# Patient Record
Sex: Male | Born: 1990 | Race: Black or African American | Hispanic: No | Marital: Single | Smoking: Heavy tobacco smoker
Health system: Southern US, Community
[De-identification: ages and names within clinical notes are randomized; demographics above are authoritative.]

## PROBLEM LIST (undated history)

## (undated) DIAGNOSIS — F329 Major depressive disorder, single episode, unspecified: Secondary | ICD-10-CM

## (undated) DIAGNOSIS — F419 Anxiety disorder, unspecified: Secondary | ICD-10-CM

## (undated) DIAGNOSIS — F32A Depression, unspecified: Secondary | ICD-10-CM

---
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
## 2010-05-20 ENCOUNTER — Emergency Department (HOSPITAL_COMMUNITY)
Admission: EM | Admit: 2010-05-20 | Discharge: 2010-05-20 | Payer: Self-pay | Source: Home / Self Care | Admitting: Emergency Medicine

## 2010-10-06 ENCOUNTER — Inpatient Hospital Stay (HOSPITAL_COMMUNITY)
Admission: EM | Admit: 2010-10-06 | Discharge: 2010-10-07 | DRG: 200 | Disposition: A | Discharge status: Home / Self Care | Payer: Self-pay | Attending: Internal Medicine | Admitting: Internal Medicine

## 2010-10-06 ENCOUNTER — Observation Stay (HOSPITAL_COMMUNITY): Payer: Self-pay

## 2010-10-06 ENCOUNTER — Emergency Department (HOSPITAL_COMMUNITY): Payer: Self-pay

## 2010-10-06 DIAGNOSIS — J9383 Other pneumothorax: Principal | ICD-10-CM | POA: Diagnosis present

## 2010-10-06 DIAGNOSIS — X58XXXA Exposure to other specified factors, initial encounter: Secondary | ICD-10-CM

## 2010-10-06 DIAGNOSIS — T797XXA Traumatic subcutaneous emphysema, initial encounter: Secondary | ICD-10-CM | POA: Diagnosis present

## 2010-10-06 LAB — BASIC METABOLIC PANEL
CO2: 25 mEq/L (ref 19–32)
Calcium: 9.4 mg/dL (ref 8.4–10.5)
Chloride: 102 mEq/L (ref 96–112)
Creatinine, Ser: 0.91 mg/dL (ref 0.4–1.5)
Creatinine, Ser: 0.93 mg/dL (ref 0.4–1.5)
GFR calc Af Amer: >60 mL/min (ref >60)
GFR calc Af Amer: >60 mL/min (ref >60)
GFR calc non Af Amer: >60 mL/min (ref >60)
Sodium: 136 mEq/L (ref 135–145)
Sodium: 138 mEq/L (ref 135–145)

## 2010-10-06 LAB — DIFFERENTIAL
Basophils Absolute: 0 10*3/uL (ref 0.0–0.1)
Basophils Absolute: 0 10*3/uL (ref 0.0–0.1)
Basophils Relative: 0 % (ref 0–1)
Eosinophils Absolute: 0.1 10*3/uL (ref 0.0–0.7)
Eosinophils Relative: 0 % (ref 0–5)
Lymphocytes Relative: 31 % (ref 12–46)
Lymphocytes Relative: 34 % (ref 12–46)
Lymphs Abs: 3.7 10*3/uL (ref 0.7–4.0)
Monocytes Absolute: 1 10*3/uL (ref 0.1–1.0)
Myelocytes: 0 %
Neutro Abs: 6.8 10*3/uL (ref 1.7–7.7)
Neutrophils Relative %: 61 % (ref 43–77)
Neutrophils Relative %: 62 % (ref 43–77)
Promyelocytes Absolute: 0 %
nRBC: 0 /100 WBC

## 2010-10-06 LAB — CBC
HCT: 39.9 % (ref 39.0–52.0)
MCH: 23.9 pg — ABNORMAL LOW (ref 26.0–34.0)
MCV: 72.3 fL — ABNORMAL LOW (ref 78.0–100.0)
Platelets: 219 10*3/uL (ref 150–400)
RBC: 6.06 MIL/uL — ABNORMAL HIGH (ref 4.22–5.81)
RBC: 5.52 MIL/uL (ref 4.22–5.81)
RDW: 14.6 % (ref 11.5–15.5)
WBC: 13.8 10*3/uL — ABNORMAL HIGH (ref 4.0–10.5)
WBC: 10.9 10*3/uL — ABNORMAL HIGH (ref 4.0–10.5)

## 2010-10-06 LAB — TYPE AND SCREEN

## 2010-10-06 LAB — PROTIME-INR
INR: 1.08 (ref 0.00–1.49)
Prothrombin Time: 14.2 seconds (ref 11.6–15.2)

## 2010-10-06 LAB — RAPID URINE DRUG SCREEN, HOSP PERFORMED
Amphetamines: NOT DETECTED
Cocaine: NOT DETECTED
Opiates: NOT DETECTED
Tetrahydrocannabinol: POSITIVE — AB

## 2010-10-06 LAB — ABO/RH: ABO/RH(D): A POS

## 2010-10-06 LAB — APTT: aPTT: 41 seconds — ABNORMAL HIGH (ref 24–37)

## 2010-10-16 NOTE — Discharge Summary (Signed)
  [name], [name] NO.:  [id number]  MEDICAL RECORD NO.:  [id number]  LOCATION:  1432                         FACILITY:  [facility]  PHYSICIAN:  Marinda Elk, M.D.DATE OF BIRTH:  [birth year]  DATE OF ADMISSION:  10/06/2010 DATE OF DISCHARGE:  10/07/2010                              DISCHARGE SUMMARY   PRIMARY CARE DOCTOR:  None.  DISCHARGE DIAGNOSIS:  Spontaneous pneumothorax.  DISCHARGE MEDICATIONS:  Tylenol 650 mg p.o. q.4 h. p.r.n.  PROCEDURES PERFORMED: 1. Esophagram that showed no evidence of injury or perforation to     Pathology. 2. Portable chest x-ray shows pneumomediastinum.  Small amount of     subcutaneous emphysema on the right. 3. Two-view chest x-ray shows no mediastinal, soft tissue emphysema in     the right subclavicular area.  This was most likely associated with     the old rupture.  BRIEF ADMITTING H AND P:  A 20 year old male with no past medical history who was coughing forcefully on the day of admission while eating cake.  Started developing chest pain, could not swallow so he came to the ED because the pain was so severe on further evaluation.  Please refer to dictation from October 06, 2010, for further details.  PHYSICAL EXAMINATION:  VITAL SIGNS:  Temperature 98, blood pressure 110/58, pulse 62, respirations 18, saturating 100% on room air. HEENT:  Atraumatic and normocephalic. GENERAL:  He is in no acute distress. LUNGS:  Good air movement.  Clear to auscultation. NECK:  There is some crepitation on the right side of the neck. CARDIOVASCULAR:  Regular rate and rhythm with positive S1 and S2.  No murmurs, rubs, or gallops. ABDOMEN:  Positive bowel sounds, nontender, nondistended, soft. EXTREMITIES:  Positive pulses.  No clubbing, cyanosis, or edema. NEUROLOGICAL:  Nonfocal.  LABORATORY DATA:  White count on admission of 13, hemoglobin of 14.  IMAGING:  As above.  ASSESSMENT/PLAN:  Primary spontaneous pneumothorax.  He  was admitted to the hospital, was monitored on telemetry with no events.  Esophagram was done with no events.  He did not desat.  He was breathing on room air and breathing all the time 100%.  His initial management with oxygen. There was no signs of aspiration.  Repeat CT scan was done.  By the next day, the patient was doing well, tolerating his diet, and not complaining of any shortness of breath, so he was discharged in stable condition.  VITALS ON THE DAY OF DISCHARGE:  Temperature 98, pulse of 80, respiration 18, blood pressure 111/63, he was saturating 100% on room air.  LABS ON DISCHARGE:  None.     Marinda Elk, M.D.     AF/MEDQ  D:  10/07/2010  T:  10/08/2010  Job:  [id number]  Electronically Signed by Marinda Elk M.D. on 10/16/2010 06:16:18 PM

## 2010-10-19 NOTE — H&P (Signed)
NAMEUMER, Jerry NO.:  [id number]  MEDICAL RECORD NO.:  [id number]  LOCATION:  WLED                         FACILITY:  [facility]  PHYSICIAN:  Michiel Cowboy, MDDATE OF BIRTH:  1990-10-13  DATE OF ADMISSION:  10/06/2010 DATE OF DISCHARGE:                             HISTORY & PHYSICAL   ATTENDING PHYSICIAN:  Michiel Cowboy, MD  PRIMARY CARE PROVIDER:  None.  CHIEF COMPLAINT:  Painful swallowing and shortness of breath.  HISTORY OF PRESENT ILLNESS:  Patient is a 20 year old gentleman, who was eating some pancakes at [facility], then he felt like somewhat was stuck in his throat.  He felt little kind of dry.  He tried to drink some sprite and there was just burning in his throat, no matter what he did he continued to have some painful swallowing.  He drank some orange juice, he drank some water.  He tried to throw up the contents.  He was trying to cough it out, but did not seem to help.  He finally went home because he continued to feel lot of discomfort in his throat area.  He was lying down in bed and started to feel very short of breath and became panicky. He called 911 and came to Emergency Department.  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  At which point, [facility] hospitalist was called for an admission since patient is stable and will need to have an esophagram done in the morning.  He currently denies any chest pain.  He is very comfortable, speaking in full sentences and laughing.  He does not endorse currently any significant shortness of breath.  He still have some throat pain as well as pain on the right side of his neck, which is at the site of his continues emphysema.  Otherwise, he does appear to be fairly comfortable.  REVIEW OF SYSTEMS:  Otherwise  negative except for HPI.  No fevers or chills.  No vomiting.  No nausea.  He was not successful in inducing his vomiting.  He did cough up some mucus, but that is all he could do.  PAST MEDICAL HISTORY:  None.  SOCIAL HISTORY:  Patient does admit being a smoker, drinks occasionally, occasional cannabis use.  FAMILY HISTORY:  None.  MEDICATIONS:  None.  ALLERGIES:  None.  PHYSICAL EXAMINATION:  VITAL SIGNS:  Temperature 98.2, blood pressure 110/58, pulse 62, respiration 18, satting 100% on room air. GENERAL:  Patient appears to be in no acute distress currently. HEAD:  Nontraumatic.  Moist mucous membranes.  There is definite crepitus on the right side of the neck. LUNGS:  Clear to auscultation bilaterally. HEART:  Regular rate and rhythm.  No murmurs appreciated. ABDOMEN:  Soft, nontender, nondistended. EXTREMITIES:  Lower extremities:  Without clubbing, cyanosis or edema. NEUROLOGICALLY:  Grossly intact.  LABORATORY DATA:  White blood cell count 13.8, hemoglobin 14.7.  DIAGNOSTIC STUDIES:  Chest x-ray showing pneumomediastinum, which is mild and soft tissue emphysema on the right.  ASSESSMENT AND PLAN:  This  is a 20 year old gentleman with pneumomediastinum and soft tissue emphysema after episode of choking on some pancakes with vigorous coughing and attempts at inducing vomiting. Soft tissue emphysema:  This is more likely secondary to alveolar rupture, but given his painful swallowing and this being associated with a sensation of something stuck in his throat, we will also obtain an esophagram firstly in the morning to evaluate for any small esophageal perforation.  Patient is hemodynamically stable and nontoxic appearing suggesting that even if this is a small rupture, it is likely not very significant.  We will obtain results of esophagram, if it is something involving the chest structures, we will need to have a Cardiothoracic surgery involved versus if it is more near  his neck, we will have Ear, Nose and Throat involved.  Right now, we will cover him with Zosyn and observe.  We will obtain repeat chest x-ray in a.m. to see if there is any improvement.  We will put on oxygen.  Monitor vitals frequently.  Place on telemetry.  If patient becomes unstable, we will need an emergent surgical consult, but at this point as I stated, he appears to be extremely stable.  We will write for Protonix prophylaxis and sequential compression devices.  We will make the patient n.p.o.  I have spent a total of 50 minutes on this admission.     Michiel Cowboy, MD     AVD/MEDQ  D:  10/06/2010  T:  10/06/2010  Job:  [id number]  Electronically Signed by Therisa Doyne MD on 10/19/2010 09:38:00 PM

## 2011-04-18 ENCOUNTER — Encounter: Payer: Self-pay | Admitting: *Deleted

## 2011-04-18 ENCOUNTER — Emergency Department (HOSPITAL_COMMUNITY)
Admission: EM | Admit: 2011-04-18 | Discharge: 2011-04-18 | Disposition: A | Discharge status: Home / Self Care | Payer: Self-pay | Attending: Emergency Medicine | Admitting: Emergency Medicine

## 2011-04-18 DIAGNOSIS — R21 Rash and other nonspecific skin eruption: Secondary | ICD-10-CM | POA: Insufficient documentation

## 2011-04-18 DIAGNOSIS — N342 Other urethritis: Secondary | ICD-10-CM | POA: Insufficient documentation

## 2011-04-18 DIAGNOSIS — A6000 Herpesviral infection of urogenital system, unspecified: Secondary | ICD-10-CM | POA: Insufficient documentation

## 2011-04-18 DIAGNOSIS — A6002 Herpesviral infection of other male genital organs: Secondary | ICD-10-CM

## 2011-04-18 MED ORDER — AZITHROMYCIN 250 MG PO TABS
1000.0000 mg | ORAL_TABLET | Freq: Once | ORAL | Status: AC
  Administered 2011-04-18: 1000 mg via ORAL
  Filled 2011-04-18: qty 4

## 2011-04-18 MED ORDER — VALACYCLOVIR HCL 1 G PO TABS: 1000.0000 mg | ORAL_TABLET | Freq: Two times a day (BID) | ORAL | Status: AC

## 2011-04-18 MED ORDER — LIDOCAINE HCL 1 % IJ SOLN
INTRAMUSCULAR | Status: AC
  Administered 2011-04-18: 20 mL via INTRAMUSCULAR
  Filled 2011-04-18: qty 20

## 2011-04-18 MED ORDER — AZITHROMYCIN 1 G PO PACK: 1.0000 g | PACK | Freq: Once | ORAL | Status: DC

## 2011-04-18 MED ORDER — CEFTRIAXONE SODIUM 250 MG IJ SOLR
250.0000 mg | Freq: Once | INTRAMUSCULAR | Status: AC
  Administered 2011-04-18: 250 mg via INTRAMUSCULAR
  Filled 2011-04-18: qty 250

## 2011-04-18 NOTE — ED Notes (Signed)
Continues to wait for results

## 2011-04-18 NOTE — ED Provider Notes (Signed)
History     CSN: [id number]  Arrival date & time 04/18/11  0113   First MD Initiated Contact with Patient 04/18/11 [phone]      Chief Complaint  Patient presents with  . Rash  . Groin Swelling    (Consider location/radiation/quality/duration/timing/severity/associated sxs/prior treatment) HPI Comments: Patient here with a several week history of rash - states intially noticed a rash in his pubic area and on the head of his penis - states now with mild painless excoriation of the area - continues with itchy papular rash to left upper leg - none on right - states recently had unprotected intercourse and is concerned now with STD's - also reports penile discharge without dysuria  Patient is a 20 y.o. male presenting with rash. The history is provided by the patient. No language interpreter was used.  Rash  This is a new problem. The current episode started more than 1 week ago. The problem has not changed since onset.The problem is associated with nothing. There has been no fever. The rash is present on the left upper leg, groin and genitalia. The pain is at a severity of 0/10. The patient is experiencing no pain. The pain has been constant since onset. Associated symptoms include itching. Pertinent negatives include no pain and no weeping. He has tried nothing for the symptoms.    History reviewed. No pertinent past medical history.  History reviewed. No pertinent past surgical history.  History reviewed. No pertinent family history.  History  Substance Use Topics  . Smoking status: Never Smoker   . Smokeless tobacco: Not on file  . Alcohol Use: No      Review of Systems  Constitutional: Negative for fever and chills.  HENT: Negative for ear pain.   Eyes: Negative for pain.  Respiratory: Negative for cough and shortness of breath.   Cardiovascular: Negative for chest pain.  Gastrointestinal: Negative for abdominal pain.  Genitourinary: Positive for discharge, penile swelling  and genital sores. Negative for dysuria, flank pain, scrotal swelling, penile pain and testicular pain.  Musculoskeletal: Negative for back pain.  Skin: Positive for itching and rash. Negative for wound.  Neurological: Negative for headaches.  Hematological: Negative for adenopathy.    Allergies  Review of patient's allergies indicates no known allergies.  Home Medications  No current outpatient prescriptions on file.  BP 168/89  Pulse 77  Temp(Src) 98 F (36.7 C) (Oral)  Resp 20  SpO2 100%  Physical Exam  Nursing note and vitals reviewed. Constitutional: He is oriented to person, place, and time. He appears well-developed and well-nourished. No distress.  HENT:  Head: Normocephalic and atraumatic.  Right Ear: External ear normal.  Left Ear: External ear normal.  Mouth/Throat: Oropharynx is clear and moist. No oropharyngeal exudate.  Eyes: Conjunctivae are normal. Pupils are equal, round, and reactive to light. No scleral icterus.  Neck: Normal range of motion. Neck supple.  Cardiovascular: Normal rate, regular rhythm and normal heart sounds.  Exam reveals no gallop and no friction rub.   No murmur heard. Pulmonary/Chest: Effort normal and breath sounds normal. No respiratory distress. He exhibits no tenderness.  Abdominal: Soft. Bowel sounds are normal. He exhibits no distension. There is no tenderness.  Genitourinary: Testes normal.    Right testis shows no mass, no swelling and no tenderness. Left testis shows no mass, no swelling and no tenderness. Circumcised. No penile tenderness. No discharge found.  Musculoskeletal: Normal range of motion.  Lymphadenopathy:    He has no cervical adenopathy.  Neurological: He is alert and oriented to person, place, and time. No cranial nerve deficit.  Skin: Skin is warm and dry. Purpura and rash noted. Rash is vesicular. No erythema. No pallor.     Psychiatric: He has a normal mood and affect. His behavior is normal. Judgment and  thought content normal.    ED Course  Procedures (including critical care time)   Labs Reviewed  HSV 1 ANTIBODY, IGG  HSV 2 ANTIBODY, IGG  RPR  GC/CHLAMYDIA PROBE AMP, GENITAL   No results found.  Urethritis herpes    MDM  Although the lesion on his penis are painless, they appear like herpes simplex 2 infection.  I will treat for this - have sent off HSV antibody and RPR - have treated for GC and chlamydia        Scarlette Calico C. [place], [place] 04/18/11 [phone]

## 2011-04-18 NOTE — ED Notes (Signed)
Patient instructed to wait 15 minutes s/p injection for reaction

## 2011-04-18 NOTE — ED Provider Notes (Signed)
Medical screening examination/treatment/procedure(s) were performed by non-physician practitioner and as supervising physician I was immediately available for consultation/collaboration.   Hanley Seamen, MD 04/18/11 [phone]

## 2011-04-18 NOTE — ED Notes (Signed)
Patient time is up and D/c'd to the lobby to wait on his mother

## 2011-04-18 NOTE — ED Notes (Signed)
Pt in c/o penile swelling and rash to inside of legs, has not noted rash to genital area, penile discharge noted

## 2011-04-18 NOTE — ED Notes (Signed)
PA in to see patient .  Penile culture obtained and sent to lab.Francesco Runner in to draw labs

## 2011-04-20 LAB — GC/CHLAMYDIA PROBE AMP, GENITAL: Chlamydia, DNA Probe: NEGATIVE

## 2012-05-08 ENCOUNTER — Encounter (HOSPITAL_COMMUNITY): Payer: Self-pay

## 2012-05-08 ENCOUNTER — Emergency Department (HOSPITAL_COMMUNITY)
Admission: EM | Admit: 2012-05-08 | Discharge: 2012-05-09 | Disposition: A | Discharge status: Home / Self Care | Payer: No Typology Code available for payment source | Attending: Emergency Medicine | Admitting: Emergency Medicine

## 2012-05-08 DIAGNOSIS — S199XXA Unspecified injury of neck, initial encounter: Secondary | ICD-10-CM | POA: Insufficient documentation

## 2012-05-08 DIAGNOSIS — S0993XA Unspecified injury of face, initial encounter: Secondary | ICD-10-CM | POA: Insufficient documentation

## 2012-05-08 DIAGNOSIS — F1210 Cannabis abuse, uncomplicated: Secondary | ICD-10-CM | POA: Insufficient documentation

## 2012-05-08 DIAGNOSIS — Y9389 Activity, other specified: Secondary | ICD-10-CM | POA: Insufficient documentation

## 2012-05-08 DIAGNOSIS — M542 Cervicalgia: Secondary | ICD-10-CM

## 2012-05-08 DIAGNOSIS — Y92410 Unspecified street and highway as the place of occurrence of the external cause: Secondary | ICD-10-CM | POA: Insufficient documentation
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
## 2012-05-09 ENCOUNTER — Emergency Department (HOSPITAL_COMMUNITY): Payer: No Typology Code available for payment source

## 2012-05-09 ENCOUNTER — Emergency Department (HOSPITAL_COMMUNITY)
Admission: EM | Admit: 2012-05-09 | Discharge: 2012-05-10 | Disposition: A | Discharge status: Home / Self Care | Payer: No Typology Code available for payment source | Attending: Emergency Medicine | Admitting: Emergency Medicine

## 2012-05-09 ENCOUNTER — Encounter (HOSPITAL_COMMUNITY): Payer: Self-pay | Admitting: *Deleted

## 2012-05-09 DIAGNOSIS — Z79899 Other long term (current) drug therapy: Secondary | ICD-10-CM | POA: Insufficient documentation

## 2012-05-09 DIAGNOSIS — Y939 Activity, unspecified: Secondary | ICD-10-CM | POA: Insufficient documentation

## 2012-05-09 DIAGNOSIS — S0990XA Unspecified injury of head, initial encounter: Secondary | ICD-10-CM | POA: Insufficient documentation

## 2012-05-09 DIAGNOSIS — Y92410 Unspecified street and highway as the place of occurrence of the external cause: Secondary | ICD-10-CM | POA: Insufficient documentation

## 2012-05-09 MED ORDER — CYCLOBENZAPRINE HCL 10 MG PO TABS: 10.0000 mg | ORAL_TABLET | Freq: Two times a day (BID) | ORAL | Status: DC | PRN
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
## 2012-05-10 MED ORDER — MELOXICAM 7.5 MG PO TABS: 15.0000 mg | ORAL_TABLET | Freq: Every day | ORAL | Status: DC

## 2012-05-10 MED ORDER — HYDROCODONE-ACETAMINOPHEN 5-325 MG PO TABS: 1.0000 | ORAL_TABLET | Freq: Four times a day (QID) | ORAL | Status: DC | PRN

## 2012-05-10 NOTE — ED Provider Notes (Signed)
Medical screening examination/treatment/procedure(s) were performed by non-physician practitioner and as supervising physician I was immediately available for consultation/collaboration.  Jones Skene, M.D.     Jones Skene, MD 05/10/12 [phone]

## 2012-05-16 NOTE — ED Provider Notes (Signed)
Medical screening examination/treatment/procedure(s) were performed by non-physician practitioner and as supervising physician I was immediately available for consultation/collaboration.   Richardean Canal, MD 05/16/12 1500

## 2015-07-02 ENCOUNTER — Encounter (HOSPITAL_BASED_OUTPATIENT_CLINIC_OR_DEPARTMENT_OTHER): Payer: Self-pay | Admitting: Emergency Medicine

## 2015-07-02 ENCOUNTER — Emergency Department (HOSPITAL_BASED_OUTPATIENT_CLINIC_OR_DEPARTMENT_OTHER)
Admission: EM | Admit: 2015-07-02 | Discharge: 2015-07-02 | Disposition: A | Discharge status: Home / Self Care | Payer: Self-pay | Attending: Emergency Medicine | Admitting: Emergency Medicine

## 2015-07-02 ENCOUNTER — Emergency Department (HOSPITAL_BASED_OUTPATIENT_CLINIC_OR_DEPARTMENT_OTHER): Payer: Self-pay

## 2015-07-02 DIAGNOSIS — J40 Bronchitis, not specified as acute or chronic: Secondary | ICD-10-CM | POA: Insufficient documentation

## 2015-07-02 DIAGNOSIS — Z791 Long term (current) use of non-steroidal anti-inflammatories (NSAID): Secondary | ICD-10-CM | POA: Insufficient documentation

## 2015-07-02 DIAGNOSIS — J111 Influenza due to unidentified influenza virus with other respiratory manifestations: Secondary | ICD-10-CM | POA: Insufficient documentation

## 2015-07-02 DIAGNOSIS — F17210 Nicotine dependence, cigarettes, uncomplicated: Secondary | ICD-10-CM | POA: Insufficient documentation

## 2015-07-02 LAB — RAPID STREP SCREEN (MED CTR MEBANE ONLY): Streptococcus, Group A Screen (Direct): NEGATIVE

## 2015-07-02 MED ORDER — HYDROCOD POLST-CPM POLST ER 10-8 MG/5ML PO SUER: 5.0000 mL | Freq: Two times a day (BID) | ORAL | Status: DC | PRN

## 2015-07-02 MED ORDER — HYDROCOD POLST-CPM POLST ER 10-8 MG/5ML PO SUER
5.0000 mL | Freq: Once | ORAL | Status: AC
  Administered 2015-07-02: 5 mL via ORAL
  Filled 2015-07-02: qty 5

## 2015-07-02 MED ORDER — ALBUTEROL SULFATE HFA 108 (90 BASE) MCG/ACT IN AERS
2.0000 | INHALATION_SPRAY | RESPIRATORY_TRACT | Status: DC | PRN
  Administered 2015-07-02: 2 via RESPIRATORY_TRACT
  Filled 2015-07-02: qty 6.7

## 2015-07-02 NOTE — ED Notes (Signed)
Pt reports frequent cough to the point his throat is now sore

## 2015-07-02 NOTE — ED Provider Notes (Signed)
CSN: [id number]     Arrival date & time 07/02/15  0252 History   First MD Initiated Contact with Patient 07/02/15 0344     Chief Complaint  Patient presents with  . Cough     (Consider location/radiation/quality/duration/timing/severity/associated sxs/prior Treatment) HPI  This is a 25 year old male with a two-day history of fever, body aches, malaise, scratchy throat, rhinorrhea, postnasal drip and cough. The cough has been severe at times and occurs in paroxysms. He feels like he is not able to take a full deep breath. He is not aware of wheezing. He has not had nausea, vomiting or diarrhea.  History reviewed. No pertinent past medical history. History reviewed. No pertinent past surgical history. History reviewed. No pertinent family history. Social History  Substance Use Topics  . Smoking status: Heavy Tobacco Smoker    Types: Cigarettes  . Smokeless tobacco: None  . Alcohol Use: No    Review of Systems  All other systems reviewed and are negative.   Allergies  Review of patient's allergies indicates no known allergies.  Home Medications   Prior to Admission medications   Medication Sig Start Date End Date Taking? Authorizing Provider  cyclobenzaprine (FLEXERIL) 10 MG tablet Take 1 tablet (10 mg total) by mouth 2 (two) times daily as needed for muscle spasms. 05/09/12   Nicole Pisciotta, PA-C  HYDROcodone-acetaminophen (NORCO) 5-325 MG per tablet Take 1-2 tablets by mouth every 6 (six) hours as needed for pain. 05/10/12   Arthor CaptainAbigail Harris, PA-C  meloxicam (MOBIC) 7.5 MG tablet Take 2 tablets (15 mg total) by mouth daily. 05/10/12   Abigail Harris, PA-C   BP 134/80 mmHg  Pulse 94  Temp(Src) 100.6 F (38.1 C) (Oral)  Resp 18  SpO2 98%   Physical Exam  General: Well-developed, well-nourished male in no acute distress; appearance consistent with age of record HENT: normocephalic; atraumatic; pharyngeal erythema without exudate Eyes: pupils equal, round and reactive to  light; extraocular muscles intact Neck: supple; no lymphadenopathy Heart: regular rate and rhythm Lungs: Decreased air movement without wheezing Abdomen: soft; nondistended; nontender; bowel sounds present Extremities: No deformity; full range of motion; pulses normal Neurologic: Awake, alert and oriented; motor function intact in all extremities and symmetric; no facial droop Skin: Warm and dry Psychiatric: Normal mood and affect    ED Course  Procedures (including critical care time)   MDM   Nursing notes and vitals signs, including pulse oximetry, reviewed.  Summary of this visit's results, reviewed by myself:  Labs:  Results for orders placed or performed during the hospital encounter of 07/02/15 (from the past 24 hour(s))  Rapid strep screen     Status: None   Collection Time: 07/02/15  3:20 AM  Result Value Ref Range   Streptococcus, Group A Screen (Direct) NEGATIVE NEGATIVE    Imaging Studies: Dg Chest 2 View  07/02/2015  CLINICAL DATA:  Cough with sore throat for 2 days. EXAM: CHEST  2 VIEW COMPARISON:  Chest and rib radiographs 05/10/2012 FINDINGS: The cardiomediastinal contours are normal. The lungs are clear. Pulmonary vasculature is normal. No consolidation, pleural effusion, or pneumothorax. No acute osseous abnormalities are seen. IMPRESSION: No acute pulmonary process. Electronically Signed   By: Rubye OaksMelanie  Ehinger M.D.   On: 07/02/2015 03:47       Paula LibraJohn Amra Shukla, MD 07/02/15 [phone]

## 2015-07-04 LAB — CULTURE, GROUP A STREP (THRC)

## 2015-09-02 ENCOUNTER — Encounter (HOSPITAL_COMMUNITY): Payer: Self-pay | Admitting: Nurse Practitioner

## 2015-09-02 ENCOUNTER — Ambulatory Visit (HOSPITAL_COMMUNITY)
Admission: EM | Admit: 2015-09-02 | Discharge: 2015-09-02 | Disposition: A | Discharge status: Home / Self Care | Payer: No Typology Code available for payment source | Attending: Emergency Medicine | Admitting: Emergency Medicine

## 2015-09-02 DIAGNOSIS — N489 Disorder of penis, unspecified: Secondary | ICD-10-CM

## 2015-09-02 DIAGNOSIS — R21 Rash and other nonspecific skin eruption: Secondary | ICD-10-CM

## 2015-09-02 DIAGNOSIS — F17210 Nicotine dependence, cigarettes, uncomplicated: Secondary | ICD-10-CM | POA: Insufficient documentation

## 2015-09-02 LAB — POCT URINALYSIS DIP (DEVICE)
BILIRUBIN URINE: NEGATIVE
GLUCOSE, UA: NEGATIVE mg/dL
Hgb urine dipstick: NEGATIVE
KETONES UR: NEGATIVE mg/dL
LEUKOCYTES UA: NEGATIVE
Nitrite: NEGATIVE
Protein, ur: NEGATIVE mg/dL
SPECIFIC GRAVITY, URINE: 1.025 (ref 1.005–1.030)
UROBILINOGEN UA: 0.2 mg/dL (ref 0.0–1.0)
pH: 6 (ref 5.0–8.0)

## 2015-09-02 MED ORDER — MUPIROCIN 2 % EX OINT: 1.0000 "application " | TOPICAL_OINTMENT | Freq: Two times a day (BID) | CUTANEOUS | Status: DC

## 2015-09-02 MED ORDER — VALACYCLOVIR HCL 1 G PO TABS: 1000.0000 mg | ORAL_TABLET | Freq: Two times a day (BID) | ORAL | Status: AC

## 2015-09-02 NOTE — ED Notes (Signed)
Pt c/o itchy, open bumps to penis. Denies any penile discharge or burning. Concerned for STD.

## 2015-09-02 NOTE — ED Provider Notes (Signed)
CSN: [id number]     Arrival date & time 09/02/15  1722 History   First MD Initiated Contact with Patient 09/02/15 1931     Chief Complaint  Patient presents with  . SEXUALLY TRANSMITTED DISEASE   (Consider location/radiation/quality/duration/timing/severity/associated sxs/prior Treatment) HPI  He is a 25 year old man here for STD check. He developed a rash on the base of his penis several days ago. He states initially it was a bump, he doesn't think it was a blister. It was itchy and is now an open sore. He has had several other bumps come up. No penile discharge. No dysuria. He does report a new sexual partner. They did not use condoms. No fevers.  History reviewed. No pertinent past medical history. History reviewed. No pertinent past surgical history. History reviewed. No pertinent family history. Social History  Substance Use Topics  . Smoking status: Heavy Tobacco Smoker    Types: Cigarettes  . Smokeless tobacco: None  . Alcohol Use: Yes    Review of Systems As in history of present illness Allergies  Review of patient's allergies indicates no known allergies.  Home Medications   Prior to Admission medications   Medication Sig Start Date End Date Taking? Authorizing Provider  mupirocin ointment (BACTROBAN) 2 % Apply 1 application topically 2 (two) times daily. 09/02/15   Charm RingsErin J Leul Narramore, MD  valACYclovir (VALTREX) 1000 MG tablet Take 1 tablet (1,000 mg total) by mouth 2 (two) times daily. 09/02/15 09/16/15  Charm RingsErin J Alexandr Yaworski, MD   Meds Ordered and Administered this Visit  Medications - No data to display  BP 120/72 mmHg  Pulse 88  Temp(Src) 98.2 F (36.8 C) (Oral)  Resp 18  SpO2 100% No data found.   Physical Exam  Constitutional: He is oriented to person, place, and time. He appears well-developed and well-nourished. No distress.  Cardiovascular: Normal rate.   Pulmonary/Chest: Effort normal.  Genitourinary:  He has several papules at the base of the penis. One of these is  open, likely from excoriation.  Neurological: He is alert and oriented to person, place, and time.    ED Course  Procedures (including critical care time)  Labs Review Labs Reviewed  HERPES SIMPLEX VIRUS CULTURE  HIV ANTIBODY (ROUTINE TESTING)  RPR  POCT URINALYSIS DIP (DEVICE)  URINE CYTOLOGY ANCILLARY ONLY    Imaging Review No results found.   MDM   1. Penile rash    STD testing collected, including HSV culture. Will place presumptively on Valtrex until culture returns. Also mupirocin ointment twice a day to cover folliculitis. Follow-up as needed.    Charm RingsErin J Hazelee Harbold, MD 09/02/15 2003

## 2015-09-02 NOTE — Discharge Instructions (Signed)
We have collected testing for STDs. We will call you with the results in 2-3 days. In the meantime, please take Valtrex twice a day. This will cover possible herpes. Use the mupirocin ointment twice a day on the rash. Follow-up as needed.

## 2015-09-03 LAB — HIV ANTIBODY (ROUTINE TESTING W REFLEX): HIV Screen 4th Generation wRfx: NONREACTIVE

## 2015-09-03 LAB — URINE CYTOLOGY ANCILLARY ONLY
CHLAMYDIA, DNA PROBE: NEGATIVE
NEISSERIA GONORRHEA: NEGATIVE
Trichomonas: NEGATIVE

## 2015-09-03 LAB — RPR: RPR Ser Ql: NONREACTIVE

## 2015-09-04 LAB — HERPES SIMPLEX VIRUS(HSV) DNA BY PCR
HSV 1 DNA: NEGATIVE
HSV 2 DNA: POSITIVE — AB

## 2015-09-10 ENCOUNTER — Telehealth (HOSPITAL_COMMUNITY): Payer: Self-pay | Admitting: Emergency Medicine

## 2015-09-10 NOTE — ED Notes (Signed)
LM on pt's VM [phone] Need to give lab results from recent visit on 5/8 Also let pt know labs can be obtained from MyChart  Per Dr. Dayton ScrapeMurray,  Clinical staff, please notify patient that he is positive for genital herpes. He should continue the prescription for Valtrex. If he develops recurrent lesions, he can follow up here or with his PCP

## 2015-09-13 NOTE — ED Notes (Signed)
Called [name] and notified of recent lab results from visit 5/8 [name] ID'd properly... Reports feeling better and sx have subsided w/o taking Rx's; reports he never p/u Rx at pharmacy.   Adv [name] to p/u Rx as soon as possible b/c pharmacy will put them back and he will have to be seen to get another Rx.   Per Dr. Dayton ScrapeMurray,  Clinical staff, please notify patient that he is positive for genital herpes. He should continue the prescription for Valtrex. If he develops recurrent lesions, he can follow up here or with his PCP  Adv [name] if sx are not getting better to return  [name] verb understanding Education on safe sex given

## 2016-08-08 ENCOUNTER — Encounter (HOSPITAL_COMMUNITY): Payer: Self-pay | Admitting: Emergency Medicine

## 2016-08-08 ENCOUNTER — Emergency Department (HOSPITAL_COMMUNITY)
Admission: EM | Admit: 2016-08-08 | Discharge: 2016-08-08 | Disposition: A | Discharge status: Home / Self Care | Payer: Self-pay | Attending: Emergency Medicine | Admitting: Emergency Medicine

## 2016-08-08 DIAGNOSIS — Y92008 Other place in unspecified non-institutional (private) residence as the place of occurrence of the external cause: Secondary | ICD-10-CM | POA: Insufficient documentation

## 2016-08-08 DIAGNOSIS — T148XXA Other injury of unspecified body region, initial encounter: Secondary | ICD-10-CM

## 2016-08-08 DIAGNOSIS — M545 Low back pain, unspecified: Secondary | ICD-10-CM

## 2016-08-08 DIAGNOSIS — S61412A Laceration without foreign body of left hand, initial encounter: Secondary | ICD-10-CM

## 2016-08-08 DIAGNOSIS — S60456A Superficial foreign body of right little finger, initial encounter: Secondary | ICD-10-CM | POA: Insufficient documentation

## 2016-08-08 DIAGNOSIS — Y9339 Activity, other involving climbing, rappelling and jumping off: Secondary | ICD-10-CM | POA: Insufficient documentation

## 2016-08-08 DIAGNOSIS — Y999 Unspecified external cause status: Secondary | ICD-10-CM | POA: Insufficient documentation

## 2016-08-08 DIAGNOSIS — F17210 Nicotine dependence, cigarettes, uncomplicated: Secondary | ICD-10-CM | POA: Insufficient documentation

## 2016-08-08 DIAGNOSIS — W1839XA Other fall on same level, initial encounter: Secondary | ICD-10-CM | POA: Insufficient documentation

## 2016-08-08 DIAGNOSIS — Z23 Encounter for immunization: Secondary | ICD-10-CM | POA: Insufficient documentation

## 2016-08-08 MED ORDER — CEPHALEXIN 500 MG PO CAPS: 500.0000 mg | ORAL_CAPSULE | Freq: Four times a day (QID) | ORAL | 0 refills | Status: AC

## 2016-08-08 MED ORDER — TETANUS-DIPHTH-ACELL PERTUSSIS 5-2.5-18.5 LF-MCG/0.5 IM SUSP
0.5000 mL | Freq: Once | INTRAMUSCULAR | Status: AC
  Administered 2016-08-08: 0.5 mL via INTRAMUSCULAR
  Filled 2016-08-08: qty 0.5

## 2016-08-08 NOTE — ED Notes (Signed)
Wound cleansed and clean, dry dressing placed.

## 2016-08-08 NOTE — ED Provider Notes (Signed)
[facility]-EMERGENCY DEPT Provider Note   CSN: [id number] Arrival date & time: 08/08/16  1526  By signing my name below, I, Nelwyn Salisbury, attest that this documentation has been prepared under the direction and in the presence of non-physician practitioner, Candie Mile, PA-C. Electronically Signed: Nelwyn Salisbury, Scribe. 08/08/2016. 4:54 PM.  History   Chief Complaint Chief Complaint  Patient presents with  . Back Pain  . Hand Injury   The history is provided by the patient. No language interpreter was used.     HPI Comments:  [name] is an otherwise healthy 26 y.o. male who presents to the Emergency Department complaining of waxing/waning, mild lower back pain s/p fall two days ago. Pt was on a deck when he fell, scraped his left hand, and landed on his back on top of a piece of wood. He describes his back pain as 1/10 at its best and 8/10 at its worst depending on how he is seated. He reports associated left hand pain, left hand injury, subjective fever, splinter to his right fifth digit and light-headedness after the fall. He does report hitting his head after hitting his back but denies LOC. Pt has tried bandaging and keeping the area clean but notes that his wounds have been draining pus. He has tried OTC pain killers with some relief of his pain. Denies any head injury, headache, syncope, visual disturbance, nausea, vomiting, urinary incontinence, weakness, numbness or chills. Pt's tetanus is not up to date. He denies use of blood thinners. He denies any other injuries.   History reviewed. No pertinent past medical history.  There are no active problems to display for this patient.   History reviewed. No pertinent surgical history.     Home Medications    Prior to Admission medications   Medication Sig Start Date End Date Taking? Authorizing Provider  cephALEXin (KEFLEX) 500 MG capsule Take 1 capsule (500 mg total) by mouth 4 (four) times daily. 08/08/16 08/11/16   Beuna Bolding Manuel Claudene Gatliff, PA  mupirocin ointment (BACTROBAN) 2 % Apply 1 application topically 2 (two) times daily. 09/02/15   Charm Rings, MD    Family History No family history on file.  Social History Social History  Substance Use Topics  . Smoking status: Heavy Tobacco Smoker    Types: Cigarettes  . Smokeless tobacco: Current User  . Alcohol use Yes     Allergies   Patient has no known allergies.   Review of Systems Review of Systems  Constitutional: Positive for fever (Subjective). Negative for chills.  Eyes: Negative for visual disturbance.  Genitourinary: Negative for difficulty urinating and dysuria.       Negative for Incontinence  Musculoskeletal: Positive for arthralgias and back pain.  Skin: Positive for wound.  Neurological: Positive for light-headedness. Negative for syncope, weakness, numbness and headaches.     Physical Exam Updated Vital Signs BP 115/74 (BP Location: Left Arm)   Pulse 67   Temp 98.1 F (36.7 C) (Oral)   Resp 16   Ht 6' (1.829 m)   Wt 81.4 kg   SpO2 100%   BMI 24.35 kg/m   Physical Exam  Constitutional: He is oriented to person, place, and time. He appears well-developed and well-nourished.  Well appearing.   HENT:  Head: Normocephalic and atraumatic.  Nose: Nose normal.  Mouth/Throat: Oropharynx is clear and moist.  The wound, redness, swelling, TTP,  injury noted to head or scalp.  Eyes: Conjunctivae and EOM are normal. Pupils are equal, round, and  reactive to light.  Neck: Normal range of motion. No JVD present.  Normal ROM, no neck tenderness. No nuchal rigidity  Cardiovascular: Normal rate, normal heart sounds and intact distal pulses.   No murmur heard. Pulmonary/Chest: Effort normal and breath sounds normal. No stridor. No respiratory distress. He has no wheezes. He has no rales.  Normal work of breathing. No respiratory distress noted.   Abdominal: Soft. Bowel sounds are normal. There is no tenderness. There is no  rebound and no guarding.  Soft and nontender. No rebound or guarding. No pulsatile mass noted.   Musculoskeletal: Normal range of motion. He exhibits tenderness. He exhibits no deformity.  There is tenderness to lower lumbar/sacral spine. No midline cervical, thoracic. No paraspinal tenderness. Good ROM of spine. No deformity. No obvious wound, redness, or swelling noted.   Neurological: He is alert and oriented to person, place, and time.  Cranial Nerves:  III,IV, VI: ptosis not present, extra-ocular movements intact bilaterally, direct and consensual pupillary light reflexes intact bilaterally V: facial sensation, jaw opening, and bite strength equal bilaterally VII: eyebrow raise, eyelid close, smile, frown, pucker equal bilaterally VIII: hearing grossly normal bilaterally  IX,X: palate elevation and swallowing intact XI: bilateral shoulder shrug and lateral head rotation equal and strong XII: midline tongue extension  Negative pronator drift, negative Romberg, negative RAM's, negative heel-to-shin, negative finger to nose.    Sensory intact.  Muscle strength 5/5 Patient able to ambulate without difficulty.   SLR Right -  negative SLR Left -  negative  Skin: Skin is warm. Capillary refill takes less than 2 seconds. No erythema.  1cm splinter to fifth digit of right hand. No pain on palpation. No surrounding erythema. Sensation, strength against resistance to flexion and extension of affected finger, and distal pulses intact.   6cm superficial laceration with two separate 1cm areas of slight opening of superficial skin on palm of left hand. No deep laceration requiring a laceration even if it was within the window of time for possible suture. No surrounding erythema. Sensation, strength good, and distal pulses intact. Strength good against resistance to left wrist, and finger flexion and extension. Able to make a fist with no difficulty.  No foreign bodies noted.   Psychiatric: He has a  normal mood and affect. His behavior is normal.  Nursing note and vitals reviewed.    ED Treatments / Results  DIAGNOSTIC STUDIES:  Oxygen Saturation is 100% on RA, normal by my interpretation.    COORDINATION OF CARE:  5:27 PM Discussed treatment plan with pt at bedside which includes exploration of wound on left hand and pt agreed to plan.  Labs (all labs ordered are listed, but only abnormal results are displayed) Labs Reviewed - No data to display  EKG  EKG Interpretation None       Radiology No results found.  Procedures .Foreign Body Removal Date/Time: 08/08/2016 5:29 PM Performed by: Alvina Chou Authorized by: Alvina Chou  Consent: Verbal consent obtained. Risks and benefits: risks, benefits and alternatives were discussed Consent given by: patient Patient understanding: patient states understanding of the procedure being performed Patient consent: the patient's understanding of the procedure matches consent given Procedure consent: procedure consent matches procedure scheduled Relevant documents: relevant documents present and verified Patient identity confirmed: verbally with patient Body area: skin General location: upper extremity Location details: right index finger  Sedation: Patient sedated: no Patient restrained: no Patient cooperative: yes Localization method: visualized Removal mechanism: forceps Dressing: antibiotic ointment Tendon involvement:  none Depth: subcutaneous Complexity: simple 1 objects recovered. Objects recovered: Splinter Post-procedure assessment: foreign body removed Patient tolerance: Patient tolerated the procedure well with no immediate complications   (including critical care time)  Medications Ordered in ED Medications  Tdap (BOOSTRIX) injection 0.5 mL (0.5 mLs Intramuscular Given 08/08/16 1750)     Initial Impression / Assessment and Plan / ED Course  I have reviewed the triage vital  signs and the nursing notes.  Pertinent labs & imaging results that were available during my care of the patient were reviewed by me and considered in my medical decision making (see chart for details).    Patient here with post mechanical fall 2 days ago. Patient with back pain. Possible tail bone injury.  No neurological deficits and normal neuro exam. Patient is ambulatory.  No loss of bowel or bladder control.  No concern for cauda equina.  No fever here, night sweats, weight loss, h/o cancer, IVDA, no recent procedure to back. No urinary symptoms suggestive of UTI.  Supportive care and return precaution discussed.   Patient's left hand with injury appears to have a laceration about 6 cm. No deep laceration going through entirety of skin, but does go through superficial skin. It is over the past 12 hours of incidents and not candidate for laceration repair. No surrounding erythema, does not appear to be infectious. No tenderness to palpation.  Right fifth digit with apparent splinter. No surrounding erythema. No discharge. Does not appear to be infectious. No tenderness to palpation. Splinter removed by me without requiring anesthesia and no pain or complications. He has full sensation, muscle strength against resistance to flexion and extension and distal pulses are intact to bilateral upper extremities including hands and distal to areas affected.   Due to patients history and exam I do not think it is appropriate to do CT of head especially considering the incident happened several days ago.   Patient will be given tetanus here in ED. Patient will be given antibiotics with instructions to follow up with his primary care provider, urgent care, or back here in ED for wound recheck. Appears safe for discharge at this time. Follow up as indicated in discharge paperwork. Reasons to immediately return to the ED discussed.    Final Clinical Impressions(s) / ED Diagnoses   Final diagnoses:  Acute  midline low back pain without sciatica  Laceration of left hand without foreign body, initial encounter  Splinter in skin    New Prescriptions New Prescriptions   CEPHALEXIN (KEFLEX) 500 MG CAPSULE    Take 1 capsule (500 mg total) by mouth 4 (four) times daily.  I personally performed the services described in this documentation, which was scribed in my presence. The recorded information has been reviewed and is accurate.    [address] [place], [place] 08/08/16 1814    Jacalyn Lefevre, MD 08/08/16 2001

## 2016-08-08 NOTE — ED Triage Notes (Signed)
Pt. Stated, I was jumping on the porch and fell back, scraped my hands and hurt my back, lower where I sit.

## 2016-08-08 NOTE — Discharge Instructions (Signed)
Please take Keflex 4 times daily for 3 days. Keep area clean and dry. Please also use Neosporin over the area affected. Please follow up and 35 days to your primary care provider, urgent care or back here in ED for wound recheck. Please return if she see any redness from the area, fevers, chills, increased pain.  Get help right away if: You develop new bowel or bladder control problems. You have unusual weakness or numbness in your arms or legs. You develop nausea or vomiting. You develop abdominal pain. You feel faint. Contact a health care provider if: You develop unusual or increased swelling or redness around the wound. You have increasing pain or tenderness. There is increasing fluid (drainage) or a bad smelling drainage coming from the wound.

## 2017-02-16 ENCOUNTER — Emergency Department (HOSPITAL_COMMUNITY)
Admission: EM | Admit: 2017-02-16 | Discharge: 2017-02-16 | Disposition: A | Discharge status: Home / Self Care | Payer: Self-pay | Attending: Emergency Medicine | Admitting: Emergency Medicine

## 2017-02-16 ENCOUNTER — Encounter (HOSPITAL_COMMUNITY): Payer: Self-pay | Admitting: Emergency Medicine

## 2017-02-16 DIAGNOSIS — R309 Painful micturition, unspecified: Secondary | ICD-10-CM | POA: Insufficient documentation

## 2017-02-16 DIAGNOSIS — Z5321 Procedure and treatment not carried out due to patient leaving prior to being seen by health care provider: Secondary | ICD-10-CM | POA: Insufficient documentation

## 2017-02-16 NOTE — ED Triage Notes (Signed)
Patient reports that he had lots of bubbles in urine over the past couple weeks and states that had he had little stinging with urination. Patient reports that he wants to be checked for HIV, Hep C, Syphilis due to having sexual relations with a girl who has extreme bleeding gums.  Patient states, "I dont [facility] to be [facility] and chlamydia because I dont want no stick up in me".

## 2017-02-16 NOTE — ED Triage Notes (Signed)
Pt not in lobby.  

## 2017-02-16 NOTE — ED Notes (Signed)
Bed: WTR6 Expected date:  Expected time:  Means of arrival:  Comments: 

## 2017-02-28 ENCOUNTER — Emergency Department (HOSPITAL_COMMUNITY): Payer: Self-pay

## 2017-02-28 ENCOUNTER — Other Ambulatory Visit: Payer: Self-pay

## 2017-02-28 ENCOUNTER — Emergency Department (HOSPITAL_COMMUNITY)
Admission: EM | Admit: 2017-02-28 | Discharge: 2017-02-28 | Disposition: A | Discharge status: Home / Self Care | Payer: Self-pay | Attending: Emergency Medicine | Admitting: Emergency Medicine

## 2017-02-28 ENCOUNTER — Encounter (HOSPITAL_COMMUNITY): Payer: Self-pay | Admitting: *Deleted

## 2017-02-28 DIAGNOSIS — F17210 Nicotine dependence, cigarettes, uncomplicated: Secondary | ICD-10-CM | POA: Insufficient documentation

## 2017-02-28 DIAGNOSIS — K5909 Other constipation: Secondary | ICD-10-CM | POA: Insufficient documentation

## 2017-02-28 DIAGNOSIS — R1084 Generalized abdominal pain: Secondary | ICD-10-CM | POA: Insufficient documentation

## 2017-02-28 LAB — URINALYSIS, ROUTINE W REFLEX MICROSCOPIC
Bilirubin Urine: NEGATIVE
GLUCOSE, UA: NEGATIVE mg/dL
HGB URINE DIPSTICK: NEGATIVE
Ketones, ur: 20 mg/dL — AB
Leukocytes, UA: NEGATIVE
Nitrite: NEGATIVE
PH: 6 (ref 5.0–8.0)
Protein, ur: NEGATIVE mg/dL
SPECIFIC GRAVITY, URINE: 1.028 (ref 1.005–1.030)

## 2017-02-28 LAB — LIPASE, BLOOD: LIPASE: 19 U/L (ref 11–51)

## 2017-02-28 LAB — CBC WITH DIFFERENTIAL/PLATELET
BASOS PCT: 1 %
Basophils Absolute: 0.1 10*3/uL (ref 0.0–0.1)
EOS PCT: 4 %
Eosinophils Absolute: 0.4 10*3/uL (ref 0.0–0.7)
HEMATOCRIT: 43.7 % (ref 39.0–52.0)
HEMOGLOBIN: 14.5 g/dL (ref 13.0–17.0)
Lymphocytes Relative: 37 %
Lymphs Abs: 3.5 10*3/uL (ref 0.7–4.0)
MCH: 24.5 pg — AB (ref 26.0–34.0)
MCHC: 33.2 g/dL (ref 30.0–36.0)
MCV: 73.7 fL — AB (ref 78.0–100.0)
MONO ABS: 0.7 10*3/uL (ref 0.1–1.0)
MONOS PCT: 7 %
NEUTROS PCT: 51 %
Neutro Abs: 4.7 10*3/uL (ref 1.7–7.7)
PLATELETS: 216 10*3/uL (ref 150–400)
RBC: 5.93 MIL/uL — ABNORMAL HIGH (ref 4.22–5.81)
RDW: 14.3 % (ref 11.5–15.5)
WBC: 9.4 10*3/uL (ref 4.0–10.5)

## 2017-02-28 LAB — COMPREHENSIVE METABOLIC PANEL
ALT: 10 U/L — AB (ref 17–63)
AST: 16 U/L (ref 15–41)
Albumin: 3.8 g/dL (ref 3.5–5.0)
Alkaline Phosphatase: 46 U/L (ref 38–126)
Anion gap: 9 (ref 5–15)
BUN: 9 mg/dL (ref 6–20)
CHLORIDE: 103 mmol/L (ref 101–111)
CO2: 27 mmol/L (ref 22–32)
CREATININE: 0.98 mg/dL (ref 0.61–1.24)
Calcium: 9.1 mg/dL (ref 8.9–10.3)
GFR calc Af Amer: >60 mL/min (ref >60)
GFR calc non Af Amer: >60 mL/min (ref >60)
Glucose, Bld: 98 mg/dL (ref 65–99)
POTASSIUM: 3.7 mmol/L (ref 3.5–5.1)
SODIUM: 139 mmol/L (ref 135–145)
Total Bilirubin: 1 mg/dL (ref 0.3–1.2)
Total Protein: 6.8 g/dL (ref 6.5–8.1)

## 2017-02-28 NOTE — ED Provider Notes (Signed)
[facility]-EMERGENCY DEPT Provider Note   CSN: [id number] Arrival date & time: 02/28/17  1407     History   Chief Complaint Chief Complaint  Patient presents with  . Abdominal Pain  . Constipation    HPI [name] is a 26 y.o. male without significant PMHx, presenting to ED with greater than 1 month of intermittent abdominal pains that move around his abdomen.  He states pain is made worse when he sleeps on his stomach, however pains not associated with meals. In the ED, he states his pain is minimal.   It has been about 5 days since he had a bowel movement, and that bowel movement was hard and then soft. He is states he is still passing gas.  He states his diet has been poor lately, and attributes the belly pains to this.  He states he is here to get lab work to make sure nothing is wrong.  States his urine has been dark, however no dysuria or frequency. Denies blood in stool, diarrhea, vomiting, fever or chills.  No history of abdominal surgeries.  The history is provided by the patient.    History reviewed. No pertinent past medical history.  There are no active problems to display for this patient.   History reviewed. No pertinent surgical history.     Home Medications    Prior to Admission medications   Medication Sig Start Date End Date Taking? Authorizing Provider  mupirocin ointment (BACTROBAN) 2 % Apply 1 application topically 2 (two) times daily. Patient not taking: Reported on 02/28/2017 09/02/15   Charm RingsHonig, Erin J, MD    Family History No family history on file.  Social History Social History   Tobacco Use  . Smoking status: Heavy Tobacco Smoker    Types: Cigarettes  . Smokeless tobacco: Current User  Substance Use Topics  . Alcohol use: Yes  . Drug use: Yes    Types: Marijuana     Allergies   Patient has no known allergies.   Review of Systems Review of Systems  Constitutional: Negative for chills and fever.    Gastrointestinal: Positive for abdominal pain and constipation. Negative for blood in stool, diarrhea, nausea and vomiting.  Genitourinary: Negative for discharge, dysuria, flank pain and penile pain.  All other systems reviewed and are negative.    Physical Exam Updated Vital Signs BP 129/74 (BP Location: Right Arm)   Pulse (!) 51   Temp 98.8 F (37.1 C) (Oral)   Resp 14   Ht 6' (1.829 m)   Wt 91.2 kg (201 lb)   SpO2 100%   BMI 27.26 kg/m   Physical Exam  Constitutional: He appears well-developed and well-nourished.  Non-toxic appearance. No distress.  HENT:  Head: Normocephalic and atraumatic.  Mouth/Throat: Oropharynx is clear and moist.  Eyes: Conjunctivae are normal.  Cardiovascular: Normal rate, regular rhythm, normal heart sounds and intact distal pulses.  Pulmonary/Chest: Effort normal.  Abdominal: Soft. Normal appearance, normal aorta and bowel sounds are normal. He exhibits no distension and no mass. There is tenderness in the suprapubic area and left lower quadrant. There is no CVA tenderness, no tenderness at McBurney's point and negative Murphy's sign. No hernia.  Neurological: He is alert.  Skin: Skin is warm.  Psychiatric: He has a normal mood and affect. His behavior is normal.  Nursing note and vitals reviewed.    ED Treatments / Results  Labs (all labs ordered are listed, but only abnormal results are displayed) Labs  Reviewed  COMPREHENSIVE METABOLIC PANEL - Abnormal; Notable for the following components:      Result Value   ALT 10 (*)    All other components within normal limits  CBC WITH DIFFERENTIAL/PLATELET - Abnormal; Notable for the following components:   RBC 5.93 (*)    MCV 73.7 (*)    MCH 24.5 (*)    All other components within normal limits  URINALYSIS, ROUTINE W REFLEX MICROSCOPIC - Abnormal; Notable for the following components:   Ketones, ur 20 (*)    All other components within normal limits  URINE CULTURE  LIPASE, BLOOD     EKG  EKG Interpretation None       Radiology Dg Abdomen 1 View  Result Date: 02/28/2017 CLINICAL DATA:  Initial evaluation for acute right lower quadrant abdominal pain for 1 week. EXAM: ABDOMEN - 1 VIEW COMPARISON:  None. FINDINGS: Bowel gas pattern within normal limits without obstruction or ileus. No abnormal bowel wall thickening. No free air on this single supine view the abdomen. No soft tissue mass or abnormal calcification. Osseous structures within normal limits. IMPRESSION: Nonobstructive bowel gas pattern with no radiographic evidence for acute intra-abdominal pathology. Electronically Signed   By: Rise Mu M.D.   On: 02/28/2017 17:17    Procedures Procedures (including critical care time)  Medications Ordered in ED Medications - No data to display   Initial Impression / Assessment and Plan / ED Course  I have reviewed the triage vital signs and the nursing notes.  Pertinent labs & imaging results that were available during my care of the patient were reviewed by me and considered in my medical decision making (see chart for details).     Pt w generalized abdominal pain. Patient is nontoxic, nonseptic appearing, in no apparent distress.  Patient's pain and other symptoms adequately managed in emergency department.  Labs, imaging and vitals reviewed.  Patient does not meet the SIRS or Sepsis criteria.  On repeat exam patient does not have a surgical abdomen and there are no peritoneal signs.  No indication of appendicitis, bowel obstruction, bowel perforation, cholecystitis, diverticulitis.  Patient discharged home with symptomatic treatment and given strict instructions for follow-up with their primary care physician.  Pt safe for discharge.  Discussed results, findings, treatment and follow up. Patient advised of return precautions. Patient verbalized understanding and agreed with plan.  Final Clinical Impressions(s) / ED Diagnoses   Final diagnoses:   Generalized abdominal pain    New Prescriptions This SmartLink is deprecated. Use AVSMEDLIST instead to display the medication list for a patient.   Fares Ramthun, [place] N, PA-C 02/28/17 2131    Loren Racer, MD 02/28/17 2239

## 2017-02-28 NOTE — ED Triage Notes (Signed)
CBG- 92 

## 2017-02-28 NOTE — Discharge Instructions (Signed)
Please follow up with your primary care provider if symptoms persist. Avoid greasy or fatty foods. You can eat foods high in fiber to help normalize your bowel movements. Drink plenty of water. You can take metamucil for constipation. You can take zantac or pepcid for stomach upset. Return to the ER for severely worsening abdominal pain, fever, if you stop having bowel movements, or new or concerning symptoms.

## 2017-02-28 NOTE — ED Triage Notes (Signed)
Pt brought in with EMS due to abd pain on rt side that moves aornd, has not had a bm in 5 days. Pt states he is able to pass gas. He is requesting we do blood work, "just want a blood work up"

## 2017-03-02 LAB — URINE CULTURE: Culture: NO GROWTH

## 2017-04-08 ENCOUNTER — Other Ambulatory Visit: Payer: Self-pay

## 2017-04-08 ENCOUNTER — Encounter (HOSPITAL_COMMUNITY): Payer: Self-pay | Admitting: Nurse Practitioner

## 2017-04-08 ENCOUNTER — Emergency Department (HOSPITAL_COMMUNITY)
Admission: EM | Admit: 2017-04-08 | Discharge: 2017-04-09 | Disposition: A | Discharge status: Other Acute Inpatient Hospital | Payer: Self-pay | Attending: Emergency Medicine | Admitting: Emergency Medicine

## 2017-04-08 DIAGNOSIS — F29 Unspecified psychosis not due to a substance or known physiological condition: Secondary | ICD-10-CM

## 2017-04-08 DIAGNOSIS — F1910 Other psychoactive substance abuse, uncomplicated: Secondary | ICD-10-CM

## 2017-04-08 DIAGNOSIS — F329 Major depressive disorder, single episode, unspecified: Secondary | ICD-10-CM | POA: Insufficient documentation

## 2017-04-08 DIAGNOSIS — R462 Strange and inexplicable behavior: Secondary | ICD-10-CM

## 2017-04-08 DIAGNOSIS — F23 Brief psychotic disorder: Secondary | ICD-10-CM

## 2017-04-08 DIAGNOSIS — F17210 Nicotine dependence, cigarettes, uncomplicated: Secondary | ICD-10-CM | POA: Insufficient documentation

## 2017-04-08 DIAGNOSIS — R45851 Suicidal ideations: Secondary | ICD-10-CM

## 2017-04-08 DIAGNOSIS — F32A Depression, unspecified: Secondary | ICD-10-CM

## 2017-04-08 HISTORY — DX: Depression, unspecified: F32.A

## 2017-04-08 HISTORY — DX: Anxiety disorder, unspecified: F41.9

## 2017-04-08 HISTORY — DX: Major depressive disorder, single episode, unspecified: F32.9

## 2017-04-08 LAB — RAPID URINE DRUG SCREEN, HOSP PERFORMED
Amphetamines: NOT DETECTED
Barbiturates: NOT DETECTED
Benzodiazepines: NOT DETECTED
COCAINE: NOT DETECTED
OPIATES: NOT DETECTED
Tetrahydrocannabinol: NOT DETECTED

## 2017-04-08 LAB — CBC
HCT: 43.5 % (ref 39.0–52.0)
Hemoglobin: 14.7 g/dL (ref 13.0–17.0)
MCH: 24.8 pg — ABNORMAL LOW (ref 26.0–34.0)
MCHC: 33.8 g/dL (ref 30.0–36.0)
MCV: 73.4 fL — ABNORMAL LOW (ref 78.0–100.0)
Platelets: 211 10*3/uL (ref 150–400)
RBC: 5.93 MIL/uL — ABNORMAL HIGH (ref 4.22–5.81)
RDW: 14 % (ref 11.5–15.5)
WBC: 10.1 10*3/uL (ref 4.0–10.5)

## 2017-04-08 LAB — COMPREHENSIVE METABOLIC PANEL
ALK PHOS: 45 U/L (ref 38–126)
ALT: 9 U/L — ABNORMAL LOW (ref 17–63)
ANION GAP: 8 (ref 5–15)
AST: 12 U/L — ABNORMAL LOW (ref 15–41)
Albumin: 4.7 g/dL (ref 3.5–5.0)
BILIRUBIN TOTAL: 0.9 mg/dL (ref 0.3–1.2)
BUN: 8 mg/dL (ref 6–20)
CALCIUM: 9.6 mg/dL (ref 8.9–10.3)
CO2: 26 mmol/L (ref 22–32)
Chloride: 102 mmol/L (ref 101–111)
Creatinine, Ser: 0.94 mg/dL (ref 0.61–1.24)
GFR calc non Af Amer: >60 mL/min (ref >60)
Glucose, Bld: 118 mg/dL — ABNORMAL HIGH (ref 65–99)
POTASSIUM: 3.5 mmol/L (ref 3.5–5.1)
SODIUM: 136 mmol/L (ref 135–145)
TOTAL PROTEIN: 7.7 g/dL (ref 6.5–8.1)

## 2017-04-08 LAB — ETHANOL: Alcohol, Ethyl (B): <10 mg/dL (ref <10)

## 2017-04-08 LAB — ACETAMINOPHEN LEVEL

## 2017-04-08 LAB — SALICYLATE LEVEL

## 2017-04-08 MED ORDER — ALUM & MAG HYDROXIDE-SIMETH 200-200-20 MG/5ML PO SUSP: 30.0000 mL | Freq: Four times a day (QID) | ORAL | Status: DC | PRN

## 2017-04-08 MED ORDER — IBUPROFEN 200 MG PO TABS: 600.0000 mg | ORAL_TABLET | Freq: Three times a day (TID) | ORAL | Status: DC | PRN

## 2017-04-08 MED ORDER — ONDANSETRON HCL 4 MG PO TABS: 4.0000 mg | ORAL_TABLET | Freq: Three times a day (TID) | ORAL | Status: DC | PRN

## 2017-04-08 NOTE — ED Notes (Signed)
[facility] seen [facility]. Appear aloof and lost in thought. Couldn't respond to any questions and seems to not processing words well. He just requested the TV to be turned on. Staff offered food/drink and encouraged patient to verbalize needs to staff. Routine safety checks maintained. Will continue to monitor patient. Patient remains safe on unit.

## 2017-04-08 NOTE — ED Notes (Signed)
Per the girlfriend, "he has been walking around all day acting weird. He has been suicidal in the past with no treatment. It is usually when he is stressed however, today is something different. He won't talk to anyone, he has a blank stare, he is talking to the wall, and he keeps saying he is going to kill his self." Patient did answer questions appropriately with slow response. In between each question he would began looking around the room as if he is looking for something and then stares at the wall. When asking a question there is a pause and then he would finally answer the question with little to no eye contact. Patient verbalized 'I am suicidal, they said to die. I will kill myself" in a robotic tone. Girlfriend and patient advised of the process and visitations parameters etc.

## 2017-04-08 NOTE — ED Provider Notes (Signed)
[facility]-EMERGENCY DEPT Provider Note   CSN: [id number] Arrival date & time: 04/08/17  [id number]     History   Chief Complaint Chief Complaint  Patient presents with  . Suicidal    HPI [name] is a 26 y.o. male.  Level 5 caveat for psychiatric illness.  Per patient's girlfriend, he has been walking around "acting weird".  He is talking to the wall.  He has a blank stare.  Review of systems positive for suicidal ideation.  He has had suicidal ideation in the past.      Past Medical History:  Diagnosis Date  . Anxiety   . Depression     There are no active problems to display for this patient.   History reviewed. No pertinent surgical history.     Home Medications    Prior to Admission medications   Not on File    Family History History reviewed. No pertinent family history.  Social History Social History   Tobacco Use  . Smoking status: Heavy Tobacco Smoker    Types: Cigarettes  . Smokeless tobacco: Current User  Substance Use Topics  . Alcohol use: Yes  . Drug use: Yes    Types: Marijuana     Allergies   Patient has no known allergies.   Review of Systems Review of Systems  Unable to perform ROS: Psychiatric disorder     Physical Exam Updated Vital Signs BP (!) 153/97   Pulse 60   Temp 98.4 F (36.9 C) (Oral)   Resp 16   Ht 6\' 1"  (1.854 m)   Wt 88.5 kg (195 lb)   SpO2 100%   BMI 25.73 kg/m   Physical Exam  Constitutional: He is oriented to person, place, and time. He appears well-developed and well-nourished.  HENT:  Head: Normocephalic and atraumatic.  Eyes: Conjunctivae are normal.  Neck: Neck supple.  Cardiovascular: Normal rate and regular rhythm.  Pulmonary/Chest: Effort normal and breath sounds normal.  Abdominal: Soft. Bowel sounds are normal.  Musculoskeletal: Normal range of motion.  Neurological: He is alert and oriented to person, place, and time.  Skin: Skin is warm and dry.    Psychiatric:  Flat affect, flight of ideas  Nursing note and vitals reviewed.    ED Treatments / Results  Labs (all labs ordered are listed, but only abnormal results are displayed) Labs Reviewed  COMPREHENSIVE METABOLIC PANEL - Abnormal; Notable for the following components:      Result Value   Glucose, Bld 118 (*)    AST 12 (*)    ALT 9 (*)    All other components within normal limits  ACETAMINOPHEN LEVEL - Abnormal; Notable for the following components:   Acetaminophen (Tylenol), Serum <10 (*)    All other components within normal limits  CBC - Abnormal; Notable for the following components:   RBC 5.93 (*)    MCV 73.4 (*)    MCH 24.8 (*)    All other components within normal limits  ETHANOL  SALICYLATE LEVEL  RAPID URINE DRUG SCREEN, HOSP PERFORMED    EKG  EKG Interpretation None       Radiology No results found.  Procedures Procedures (including critical care time)  Medications Ordered in ED Medications - No data to display   Initial Impression / Assessment and Plan / ED Course  I have reviewed the triage vital signs and the nursing notes.  Pertinent labs & imaging results that were available during my care of the  patient were reviewed by me and considered in my medical decision making (see chart for details).     Patient is depressed with suicidal ideation.  He may be psychotic.  Will obtain behavioral health consult  Final Clinical Impressions(s) / ED Diagnoses   Final diagnoses:  Bizarre behavior  Depression, unspecified depression type  Suicidal ideation    ED Discharge Orders    None       Donnetta Hutchingook, Terri Rorrer, MD 04/08/17 1535

## 2017-04-08 NOTE — ED Notes (Signed)
During assessment pt is responding to internal stimuli, is trembling @ times, shaking head, talking to no one.  Pt stated "I didn't graduate from HS.  I quit in the 11th grade.  I haven't worked in 3-6 months.  I live with my girl.  She's a good girl.

## 2017-04-08 NOTE — ED Triage Notes (Signed)
Pt BIB ems with c/o SI. Per ems patient reports that he has hallucinations and seeing demons that are following him. The girlfriend called ems because she felt concerned for his safety. Patient verbalized SI to ems staff with no definite plan. Has some hx in past but has not been seen. VS: 128/76, 87, 18, NSR. Girlfriend is present with patient.

## 2017-04-08 NOTE — BH Assessment (Signed)
Assessment Note  [name] is an 26 y.o. male who was brought to the hospital by EMS after his girlfriend called concerned about his behavior and her safety. She stated "he has been walking around all day acting weird. He has been suicidal in the past with no treatment. It is usually when he is stressed however, today is something different. He won't talk to anyone, he has a blank stare, he is talking to the wall, and he keeps saying he is going to kill his self." Per RN: Patient did answer questions appropriately with slow response. In between each question he would began looking around the room as if he is looking for something and then stares at the wall. When asking a question there is a pause and then he would finally answer the question with little to no eye contact. Patient verbalized 'I am suicidal, they said to die. I will kill myself" in a robotic tone. Pt was quiet during assessment and would not look directly at therapist. He states that he is hearing voices but wouldn't elaborte. Looks like he is responding to internal stimuli and keeps shaking his head from side to side. He denies any previous inpatient treatment and states that he is not on medication. UDS is negative for drugs or alcohol in his system. Pt looks fearful and asks this writer to leave he states "I'm going through something right now, I think it's best if you go." This writer could not find a previous history of this behavior in chart review. Pt denies HI at this time.   Pt meets criteria for inpatient admission per Nanine Means NP and Dr. Sharma Covert.    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 Anxiety   . Depression     History reviewed. No pertinent surgical history.  Family History: History reviewed. No pertinent family history.  Social History:  reports that he has been smoking cigarettes.  He uses smokeless tobacco. He reports that he drinks  alcohol. He reports that he uses drugs. Drug: Marijuana.  Additional Social History:     CIWA: CIWA-Ar BP: (!) 153/97 Pulse Rate: 60 COWS:    Allergies: No Known Allergies  Home Medications:  (Not in a hospital admission)  OB/GYN Status:  No LMP for male patient.  General Assessment Data [facility] of Assessment: [facility] Assessment: In system Is this a Tele or Face-to-Face Assessment?: Face-to-Face Is this an Initial Assessment or a Re-assessment for this encounter?: Initial Assessment Marital status: Long term relationship Is patient pregnant?: No Pregnancy Status: No Admission Status: Voluntary Is patient capable of signing voluntary admission?: No Referral Source: Self/Family/Friend Insurance type: SELF PAY        Education Status Is patient currently in school?: No  Risk to self with the past 6 months Suicidal Ideation: Yes-Currently Present Has patient been a risk to self within the past 6 months prior to admission? : Yes Suicidal Intent: Yes-Currently Present Has patient had any suicidal intent within the past 6 months prior to admission? : Yes Is patient at risk for suicide?: Yes Suicidal Plan?: Yes-Currently Present Has patient had any suicidal plan within the past 6 months prior to admission? : Yes Specify Current Suicidal Plan: denies  Access to Means: No What has been your use of drugs/alcohol within the last 12 months?: unknown Previous Attempts/Gestures: No How many times?: 0 Other Self Harm Risks: hearing voices Triggers for Past Attempts:  Unknown Intentional Self Injurious Behavior: None Family Suicide History: Unknown Recent stressful life event(s): Conflict (Comment) Persecutory voices/beliefs?: Yes Depression: Yes Depression Symptoms: Despondent Substance abuse history and/or treatment for substance abuse?: No Suicide prevention information given to non-admitted patients: Not applicable  Risk to Others within the past 6 months Homicidal  Ideation: No Does patient have any lifetime risk of violence toward others beyond the six months prior to admission? : No Thoughts of Harm to Others: No Current Homicidal Intent: No Current Homicidal Plan: No Access to Homicidal Means: No Identified Victim: None History of harm to others?: No Assessment of Violence: None Noted Violent Behavior Description: none Does patient have access to weapons?: No Criminal Charges Pending?: No Does patient have a court date: No Is patient on probation?: No  Psychosis Hallucinations: Auditory Delusions: Unspecified  Mental Status Report Appearance/Hygiene: Bizarre Eye Contact: Poor Motor Activity: Freedom of movement Speech: Slow, Soft Level of Consciousness: Alert Mood: Suspicious Affect: Frightened, Fearful Anxiety Level: Moderate Thought Processes: Thought Blocking Judgement: Impaired Orientation: Person, Place, Time, Situation Obsessive Compulsive Thoughts/Behaviors: Severe  Cognitive Functioning Concentration: Decreased Memory: Recent Intact, Remote Intact IQ: Average Insight: Poor Impulse Control: Poor Appetite: Fair Weight Loss: 0 Weight Gain: 0 Sleep: Decreased Total Hours of Sleep: (pt not sleeping) Vegetative Symptoms: None  ADLScreening [facility] Assessment Services) Patient's cognitive ability adequate to safely complete daily activities?: Yes Patient able to express need for assistance with ADLs?: Yes Independently performs ADLs?: Yes (appropriate for developmental age)  Prior Inpatient Therapy Prior Inpatient Therapy: (Unknown- pt didn't answer)  Prior Outpatient Therapy Prior Outpatient Therapy: (Unknown- pt didn't answer) Does patient have an ACCT team?: No Does patient have Intensive In-House Services?  : No Does patient have [facility] services? : No Does patient have P4CC services?: No  ADL Screening (condition at time of admission) Patient's cognitive ability adequate to safely complete daily activities?:  Yes Is the patient deaf or have difficulty hearing?: No Does the patient have difficulty seeing, even when wearing glasses/contacts?: No Does the patient have difficulty concentrating, remembering, or making decisions?: No Patient able to express need for assistance with ADLs?: Yes Does the patient have difficulty dressing or bathing?: No Independently performs ADLs?: Yes (appropriate for developmental age) Does the patient have difficulty walking or climbing stairs?: No Weakness of Legs: None Weakness of Arms/Hands: None  Home Assistive Devices/Equipment Home Assistive Devices/Equipment: None  Therapy Consults (therapy consults require a physician order) PT Evaluation Needed: No OT Evalulation Needed: No SLP Evaluation Needed: No Abuse/Neglect Assessment (Assessment to be complete while patient is alone) Abuse/Neglect Assessment Can Be Completed: Unable to assess, patient is non-responsive or altered mental status Values / Beliefs Cultural Requests During Hospitalization: None Spiritual Requests During Hospitalization: None Consults Spiritual Care Consult Needed: No Social Work Consult Needed: No Merchant navy officerAdvance Directives (For Healthcare) Does Patient Have a Medical Advance Directive?: No Would patient like information on creating a medical advance directive?: No - Patient declined Nutrition Screen- [facility] Adult/[facility] Patient's home diet: Regular Has the patient recently lost weight without trying?: No Has the patient been eating poorly because of a decreased appetite?: No Malnutrition Screening Tool Score: 0  Additional Information 1:1 In Past 12 Months?: No CIRT Risk: No Elopement Risk: No Does patient have medical clearance?: Yes     Disposition:  Disposition Initial Assessment Completed for this Encounter: Yes Disposition of Patient: Inpatient treatment program  On Site Evaluation by:  Donetta PottsKristin Ingvald Theisen LPC, LCAS  Reviewed with Physician:  Nanine MeansJamison Lord NP   Belenda CruiseKristin  M  Arielis Leonhart SharonLPC, LCAS  04/08/2017 2:59 PM

## 2017-04-08 NOTE — ED Notes (Signed)
Patient appear anxious. Pacing room. Dr Juleen ChinaKohut notified

## 2017-04-08 NOTE — ED Notes (Signed)
Patient's girlfriend visiting.  Patient signed a release of information form so girlfriend can visit and get info.  Patient calm and cooperative, quiet.

## 2017-04-09 DIAGNOSIS — R45851 Suicidal ideations: Secondary | ICD-10-CM

## 2017-04-09 DIAGNOSIS — F17210 Nicotine dependence, cigarettes, uncomplicated: Secondary | ICD-10-CM

## 2017-04-09 DIAGNOSIS — F1210 Cannabis abuse, uncomplicated: Secondary | ICD-10-CM

## 2017-04-09 DIAGNOSIS — R4587 Impulsiveness: Secondary | ICD-10-CM

## 2017-04-09 DIAGNOSIS — F29 Unspecified psychosis not due to a substance or known physiological condition: Secondary | ICD-10-CM

## 2017-04-09 MED ORDER — LORAZEPAM 1 MG PO TABS
1.0000 mg | ORAL_TABLET | Freq: Every evening | ORAL | Status: DC | PRN
Start: 2017-04-09 — End: 2017-04-09
  Administered 2017-04-09: 1 mg via ORAL
  Filled 2017-04-09: qty 1

## 2017-04-09 MED ORDER — RISPERIDONE 0.5 MG PO TABS
0.5000 mg | ORAL_TABLET | Freq: Two times a day (BID) | ORAL | Status: DC
  Administered 2017-04-09: 0.5 mg via ORAL
  Filled 2017-04-09: qty 1

## 2017-04-09 NOTE — ED Notes (Signed)
Pt speaks so low that it is difficult to understand what he is saying, and he appears to be thought blocking. He stood in front of the nurse's station for a while, and when asked how he could be helped, he just could not formulate his words. According to night shift pt rolled his sheet up as if he were going to use it to hang himself. This Clinical research associatewriter gave him a blanket after he contracted for safety and he is pulled up on the camera so that staff can observe him closely.

## 2017-04-09 NOTE — ED Provider Notes (Signed)
[facility] Dr. Gerhard Munchhivukuoa. No acute complaints. Stable for transfer.   [name], Jerry Peggs Eduardo, MD 04/09/17 2025

## 2017-04-09 NOTE — Progress Notes (Signed)
CSW [facility] requesting [facility]'s [facility] status. [facility]   Montine CircleKelsy Dason Mosley, Silverio LayLCSWA [facility]  [phone]

## 2017-04-09 NOTE — ED Notes (Signed)
Patient was observed via camera wrapping his his bed sheet and holding it up to his neck. Patient denied any act stated "I am ok". Bed sheet was taken away from from him and Dr. Read DriversMolpus notified - Dr. With other patient -to visit patient afterwards. Meanwhile, patient was awake all night.

## 2017-04-09 NOTE — Consult Note (Signed)
[facility] Psychiatry Consult   Reason for Consult:  SI and psychosis Referring Physician:  EDP Patient Identification: SAGAN MASELLI MRN:  [id number] Principal Diagnosis: Psychosis [facility]) Diagnosis:   Patient Active Problem List   Diagnosis Date Noted  . Psychosis ([place]) [F29] 04/09/2017    Total Time spent with patient: 30 minutes  Subjective:   CHAPIN ARDUINI is a 26 y.o. male patient admitted with bizarre behavior.  HPI:   Nyal was admitted to the hospital due to concern by his girlfriend for bizarre behavior. He was noted to be walking around all day with atypical behavior. He was responding to internal stimuli and had a blank stare. He endorsed thoughts to kill himself. On interview, he exhibited psychomotor retardation. He reported smoking marijuana recently. He was mostly incoherent due to speaking in a very low voice. He was seen wrapping a bed sheet around his neck this morning on camera. The bed sheet was taken from his room.   Past Psychiatric History: Denies  Risk to Self: Suicidal Ideation: Yes-Currently Present Suicidal Intent: Yes-Currently Present Is patient at risk for suicide?: Yes Suicidal Plan?: Yes-Currently Present Specify Current Suicidal Plan: denies  Access to Means: No What has been your use of drugs/alcohol within the last 12 months?: unknown How many times?: 0 Other Self Harm Risks: hearing voices Triggers for Past Attempts: Unknown Intentional Self Injurious Behavior: None Risk to Others: Homicidal Ideation: No Thoughts of Harm to Others: No Current Homicidal Intent: No Current Homicidal Plan: No Access to Homicidal Means: No Identified Victim: None History of harm to others?: No Assessment of Violence: None Noted Violent Behavior Description: none Does patient have access to weapons?: No Criminal Charges Pending?: No Does patient have a court date: No Prior Inpatient Therapy: Prior Inpatient Therapy: (Unknown- pt didn't answer) Prior  Outpatient Therapy: Prior Outpatient Therapy: (Unknown- pt didn't answer) Does patient have an ACCT team?: No Does patient have Intensive In-House Services?  : No Does patient have [facility] services? : No Does patient have P4CC services?: No  Past Medical History:  Past Medical History:  Diagnosis Date  . Anxiety   . Depression    History reviewed. No pertinent surgical history. Family History: History reviewed. No pertinent family history. Family Psychiatric  History: Unknown Social History:  Social History   Substance and Sexual Activity  Alcohol Use Yes     Social History   Substance and Sexual Activity  Drug Use Yes  . Types: Marijuana    Social History   Socioeconomic History  . Marital status: Single    Spouse name: None  . Number of children: None  . Years of education: None  . Highest education level: None  Social Needs  . Financial resource strain: None  . Food insecurity - worry: None  . Food insecurity - inability: None  . Transportation needs - medical: None  . Transportation needs - non-medical: None  Occupational History  . None  Tobacco Use  . Smoking status: Heavy Tobacco Smoker    Types: Cigarettes  . Smokeless tobacco: Current User  Substance and Sexual Activity  . Alcohol use: Yes  . Drug use: Yes    Types: Marijuana  . Sexual activity: Yes  Other Topics Concern  . None  Social History Narrative  . None   Additional Social History: UTA due to altered mental status.      Allergies:  No Known Allergies  Labs:  Results for orders placed or performed during the hospital encounter  of 04/08/17 (from the past 48 hour(s))  Comprehensive metabolic panel     Status: Abnormal   Collection Time: 04/08/17  6:08 AM  Result Value Ref Range   Sodium 136 135 - 145 mmol/L   Potassium 3.5 3.5 - 5.1 mmol/L   Chloride 102 101 - 111 mmol/L   CO2 26 22 - 32 mmol/L   Glucose, Bld 118 (H) 65 - 99 mg/dL   BUN 8 6 - 20 mg/dL   Creatinine, Ser 0.94 0.61 -  1.24 mg/dL   Calcium 9.6 8.9 - 10.3 mg/dL   Total Protein 7.7 6.5 - 8.1 g/dL   Albumin 4.7 3.5 - 5.0 g/dL   AST 12 (L) 15 - 41 U/L   ALT 9 (L) 17 - 63 U/L   Alkaline Phosphatase 45 38 - 126 U/L   Total Bilirubin 0.9 0.3 - 1.2 mg/dL   GFR calc non Af Amer >60 >60 mL/min   GFR calc Af Amer >60 >60 mL/min    Comment: (NOTE) The eGFR has been calculated using the CKD EPI equation. This calculation has not been validated in all clinical situations. eGFR's persistently <60 mL/min signify possible Chronic Kidney Disease.    Anion gap 8 5 - 15  Ethanol     Status: None   Collection Time: 04/08/17  6:08 AM  Result Value Ref Range   Alcohol, Ethyl (B) <10 <10 mg/dL    Comment:        LOWEST DETECTABLE LIMIT FOR SERUM ALCOHOL IS 10 mg/dL FOR MEDICAL PURPOSES ONLY   Salicylate level     Status: None   Collection Time: 04/08/17  6:08 AM  Result Value Ref Range   Salicylate Lvl <0.9 2.8 - 30.0 mg/dL  Acetaminophen level     Status: Abnormal   Collection Time: 04/08/17  6:08 AM  Result Value Ref Range   Acetaminophen (Tylenol), Serum <10 (L) 10 - 30 ug/mL    Comment:        THERAPEUTIC CONCENTRATIONS VARY SIGNIFICANTLY. A RANGE OF 10-30 ug/mL MAY BE AN EFFECTIVE CONCENTRATION FOR MANY PATIENTS. HOWEVER, SOME ARE BEST TREATED AT CONCENTRATIONS OUTSIDE THIS RANGE. ACETAMINOPHEN CONCENTRATIONS >150 ug/mL AT 4 HOURS AFTER INGESTION AND >50 ug/mL AT 12 HOURS AFTER INGESTION ARE OFTEN ASSOCIATED WITH TOXIC REACTIONS.   cbc     Status: Abnormal   Collection Time: 04/08/17  6:08 AM  Result Value Ref Range   WBC 10.1 4.0 - 10.5 K/uL   RBC 5.93 (H) 4.22 - 5.81 MIL/uL   Hemoglobin 14.7 13.0 - 17.0 g/dL   HCT 43.5 39.0 - 52.0 %   MCV 73.4 (L) 78.0 - 100.0 fL   MCH 24.8 (L) 26.0 - 34.0 pg   MCHC 33.8 30.0 - 36.0 g/dL   RDW 14.0 11.5 - 15.5 %   Platelets 211 150 - 400 K/uL  Rapid urine drug screen (hospital performed)     Status: None   Collection Time: 04/08/17 12:50 PM  Result  Value Ref Range   Opiates NONE DETECTED NONE DETECTED   Cocaine NONE DETECTED NONE DETECTED   Benzodiazepines NONE DETECTED NONE DETECTED   Amphetamines NONE DETECTED NONE DETECTED   Tetrahydrocannabinol NONE DETECTED NONE DETECTED   Barbiturates NONE DETECTED NONE DETECTED    Comment:        DRUG SCREEN FOR MEDICAL PURPOSES ONLY.  IF CONFIRMATION IS NEEDED FOR ANY PURPOSE, NOTIFY LAB WITHIN 5 DAYS.        LOWEST DETECTABLE LIMITS FOR URINE DRUG  SCREEN Drug Class       Cutoff (ng/mL) Amphetamine      1000 Barbiturate      200 Benzodiazepine   979 Tricyclics       150 Opiates          300 Cocaine          300 THC              50     Current Facility-Administered Medications  Medication Dose Route Frequency Provider Last Rate Last Dose  . alum & mag hydroxide-simeth (MAALOX/MYLANTA) 200-200-20 MG/5ML suspension 30 mL  30 mL Oral Q6H PRN Virgel Manifold, MD      . ibuprofen (ADVIL,MOTRIN) tablet 600 mg  600 mg Oral Q8H PRN Virgel Manifold, MD      . ondansetron [facility]) tablet 4 mg  4 mg Oral Q8H PRN Virgel Manifold, MD       No current outpatient medications on file.    Musculoskeletal: Strength & Muscle Tone: within normal limits Gait & Station: UTA since patient was lying in bed.  Patient leans: N/A  Psychiatric Specialty Exam: Physical Exam  Nursing note and vitals reviewed. Constitutional: He appears well-developed and well-nourished.  HENT:  Head: Normocephalic and atraumatic.  Neck: Normal range of motion.  Respiratory: Effort normal.  Musculoskeletal: Normal range of motion.  Neurological: He is alert.  Skin: No rash noted.  Psychiatric: His affect is blunt. His speech is delayed. He is slowed and withdrawn. Cognition and memory are impaired. He expresses impulsivity. He expresses suicidal ideation.    Review of Systems  Unable to perform ROS: Mental status change    Blood pressure 138/86, pulse 60, temperature 98.4 F (36.9 C), resp. rate 20, height '6\' 1"'   (1.854 m), weight 88.5 kg (195 lb), SpO2 100 %.Body mass index is 25.73 kg/m.  General Appearance: Well Groomed, young, African American male who is wearing paper hospital scrubs and lying in bed. NAD.   Eye Contact:  Fair  Speech:  Slow  Volume:  Decreased  Mood:  Did not state.  Affect:  Blunt  Thought Process: Superficially Linear  Orientation:  Other:  UTA due to altered mental status.  Thought Content:  Proverty of speech.  Suicidal Thoughts:  Yes.  with intent/plan  Homicidal Thoughts:  No  Memory:  Immediate;   Poor Recent;   Poor Remote;   Poor  Judgement:  Impaired  Insight:  Lacking  Psychomotor Activity:  Psychomotor Retardation  Concentration:  Concentration: Fair and Attention Span: Fair  Recall:  [employer] of Knowledge:  Poor  Language:  Poor  Akathisia:  NA  Handed:  Right  AIMS (if indicated):   N/A  Assets:  Housing Social Support  ADL's:  Intact  Cognition:  Impaired due to altered mental status.  Sleep:   N/A   Assessment:  CHRISOPHER PUSTEJOVSKY is a 26 y.o. male who was admitted with bizarre behavior. He has psychomotor retardation on exam and has been responding to internal stimuli. He attempted to strangle self this morning with a bed sheet. He warrants inpatient psychiatric stabilization for psychosis.   Treatment Plan Summary: Daily contact with patient to assess and evaluate symptoms and progress in treatment and Medication management  -Start Risperdal 0.5 mg BID for psychosis.  -Plan to start antidepressant if depressive symptoms are revealed after treatment of psychosis.   Disposition: Recommend psychiatric Inpatient admission when medically cleared.  Faythe Dingwall, DO 04/09/2017 10:11 AM

## 2017-04-09 NOTE — ED Notes (Signed)
[facility] tonight or in AM due to transport issues. SBAR Report received from previous nurse. Pt received calm and visible on unit. Pt gave no meaningful answers to assessment ofcurrent SI/ HI, A/V H, depression, anxiety, or pain at this time, but appears otherwise stable and free of distress. Pt gives no acknowledgement to writer but did attempt to remove finger probe and would not sit still to get temp.  Pt reminded of camera surveillance, q 15 min rounds, and rules of the milieu. Will continue to assess.

## 2017-04-09 NOTE — BH Assessment (Signed)
[facility] Progress Note  Per Juanetta BeetsJacqueline Norman, DO, this pt requires psychiatric hospitalization at this time.  At 15:55 [name] [facility] to report that pt has been accepted to their facility by [facility] FlakesVanessa [facility] to the service of Dr Loann Quillhivukula, however, pt must be placed under IVC.  Jerry MeansJamison Lord, DNP concurs with this decision, as does EDP Shaune Pollackameron Isaacs, MD, who agrees to initiate IVC, which is pending as of this writing.  Pt's nurse, [name], has been notified, and agrees to call report to [phone].  [facility] stipulates that pt must arrive either before 23:00 tonight or after 06:00 tomorrow.  [facility]  Pt's nurse has been notified, and has been asked to fax IVC documents to [facility] at [phone] once they are served.  [name], [place] Behavioral Health Coordinator [phone]

## 2018-09-17 IMAGING — CR DG ABDOMEN 1V
1 series · 1 of 1 positions shown · non-contrast
Comparison: None.

CLINICAL DATA: Initial evaluation for acute right lower quadrant
abdominal pain for 1 week.

EXAM:
ABDOMEN - 1 VIEW

[t abdomen supine]
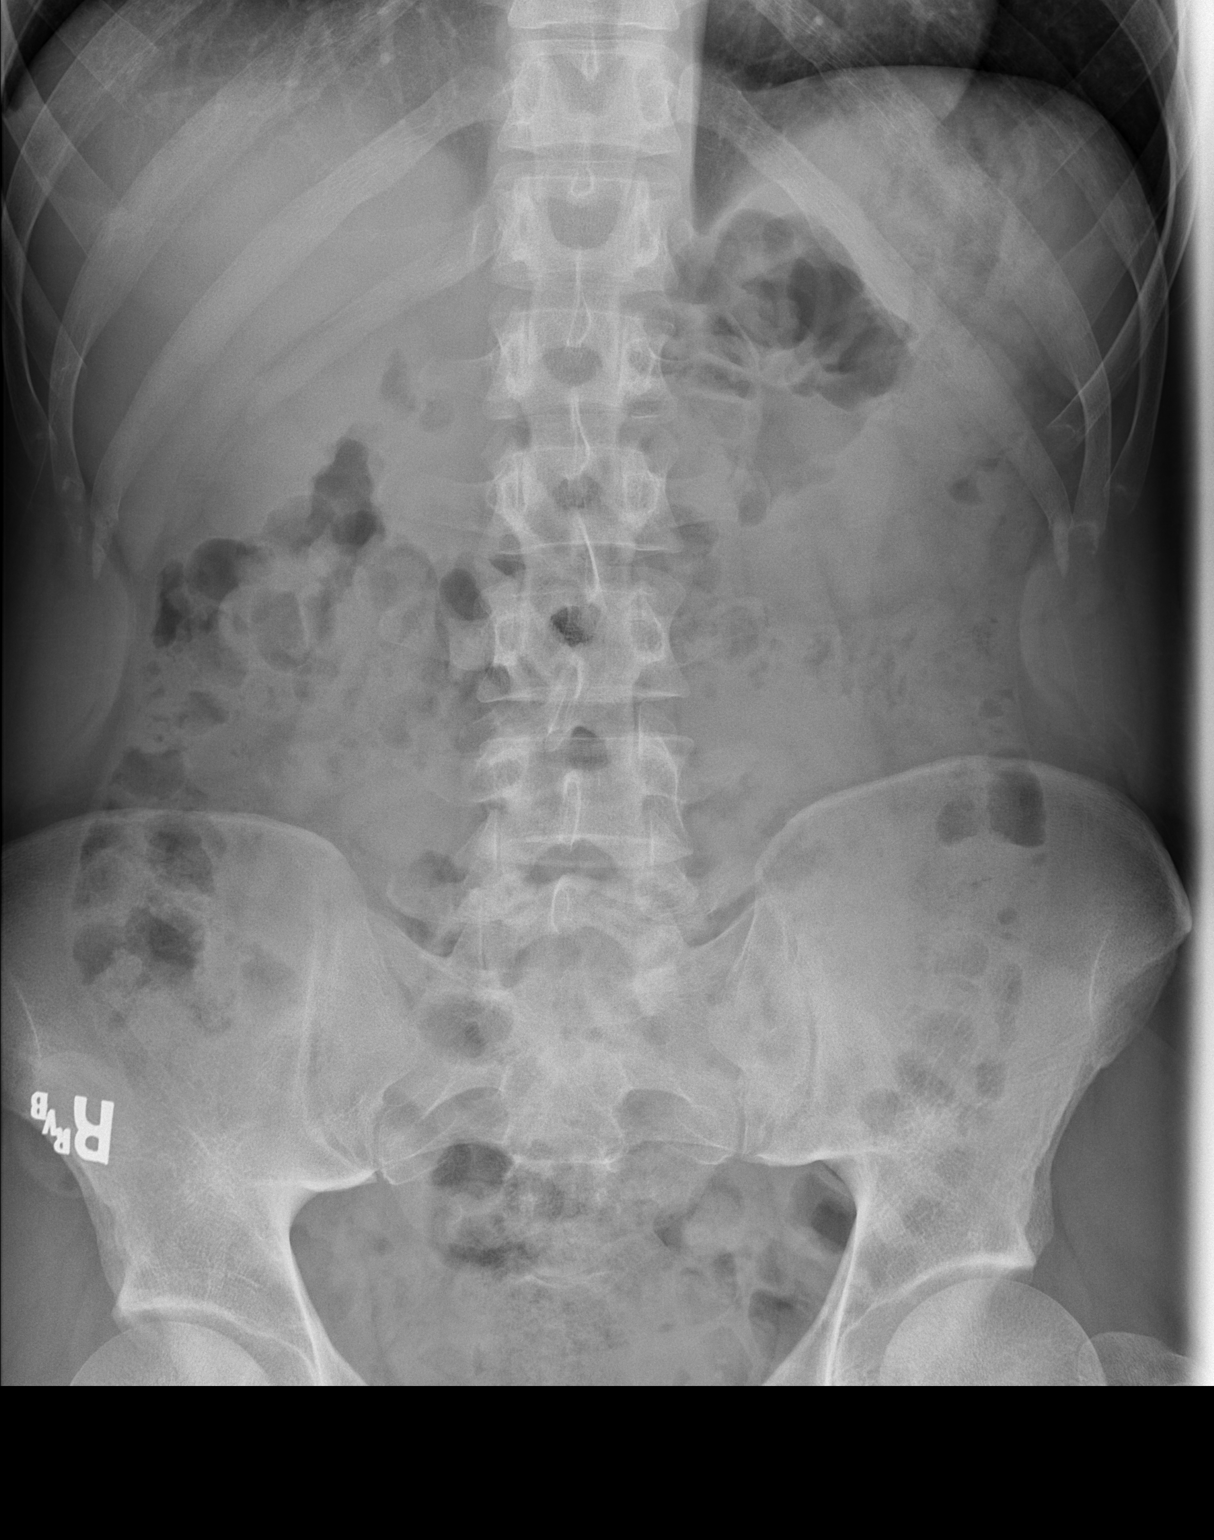

[1 of 1 positions shown; findings below may reference images not displayed]

FINDINGS: Bowel gas pattern within normal limits without obstruction or ileus.
No abnormal bowel wall thickening. No free air on this single supine
view the abdomen. No soft tissue mass or abnormal calcification.

Osseous structures within normal limits.
IMPRESSION: Nonobstructive bowel gas pattern with no radiographic evidence for
acute intra-abdominal pathology.
